# Patient Record
Sex: Female | Born: 1960 | Race: White | Hispanic: No | Marital: Married | Smoking: Never smoker
Health system: Southern US, Community
[De-identification: ages and names within clinical notes are randomized; demographics above are authoritative.]

## PROBLEM LIST (undated history)

## (undated) HISTORY — PX: ABDOMINAL HYSTERECTOMY: SHX81

---

## 2001-05-10 ENCOUNTER — Emergency Department (HOSPITAL_COMMUNITY): Admission: EM | Admit: 2001-05-10 | Discharge: 2001-05-10 | Payer: Self-pay | Admitting: Emergency Medicine

## 2001-05-10 ENCOUNTER — Encounter: Payer: Self-pay | Admitting: Emergency Medicine

## 2005-06-08 ENCOUNTER — Ambulatory Visit: Payer: Self-pay | Admitting: Unknown Physician Specialty

## 2006-07-26 ENCOUNTER — Ambulatory Visit: Payer: Self-pay | Admitting: Unknown Physician Specialty

## 2007-05-16 ENCOUNTER — Ambulatory Visit: Payer: Self-pay | Admitting: Unknown Physician Specialty

## 2007-07-29 ENCOUNTER — Ambulatory Visit: Payer: Self-pay | Admitting: Unknown Physician Specialty

## 2008-08-04 ENCOUNTER — Ambulatory Visit: Payer: Self-pay | Admitting: Unknown Physician Specialty

## 2009-11-04 ENCOUNTER — Ambulatory Visit: Payer: Self-pay | Admitting: Unknown Physician Specialty

## 2010-11-29 ENCOUNTER — Ambulatory Visit: Payer: Self-pay | Admitting: Unknown Physician Specialty

## 2012-01-24 ENCOUNTER — Ambulatory Visit: Payer: Self-pay | Admitting: Obstetrics and Gynecology

## 2012-06-28 DIAGNOSIS — I10 Essential (primary) hypertension: Secondary | ICD-10-CM | POA: Insufficient documentation

## 2012-07-04 DIAGNOSIS — E669 Obesity, unspecified: Secondary | ICD-10-CM | POA: Insufficient documentation

## 2013-01-28 ENCOUNTER — Ambulatory Visit: Payer: Self-pay | Admitting: Obstetrics and Gynecology

## 2013-12-05 ENCOUNTER — Ambulatory Visit: Payer: Self-pay | Admitting: Gastroenterology

## 2014-02-03 ENCOUNTER — Ambulatory Visit: Payer: Self-pay | Admitting: Family Medicine

## 2014-05-18 DIAGNOSIS — R7302 Impaired glucose tolerance (oral): Secondary | ICD-10-CM | POA: Insufficient documentation

## 2014-06-23 DIAGNOSIS — M1711 Unilateral primary osteoarthritis, right knee: Secondary | ICD-10-CM | POA: Insufficient documentation

## 2016-05-30 DIAGNOSIS — I83813 Varicose veins of bilateral lower extremities with pain: Secondary | ICD-10-CM | POA: Insufficient documentation

## 2016-05-30 DIAGNOSIS — J309 Allergic rhinitis, unspecified: Secondary | ICD-10-CM | POA: Insufficient documentation

## 2016-06-21 ENCOUNTER — Encounter: Payer: BC Managed Care – PPO | Attending: Pediatrics | Admitting: Dietician

## 2016-06-21 VITALS — Ht 64.0 in | Wt 279.2 lb

## 2016-06-21 DIAGNOSIS — I10 Essential (primary) hypertension: Secondary | ICD-10-CM

## 2016-06-21 DIAGNOSIS — E669 Obesity, unspecified: Secondary | ICD-10-CM

## 2016-06-21 NOTE — Progress Notes (Signed)
Medical Nutrition Therapy: Visit start time: 1630  end time: 1730  Assessment:  Diagnosis: HTN, obesity Past medical history: recent diagnosis of hyperthyroidism (subclinical), prediabetes, arthritis in knees Psychosocial issues/ stress concerns: patient reports moderate stress level Preferred learning method:  Jill Alexanders. Visual . Hands-on  Current weight: 279.2lbs  Height: 5'4" Medications, supplements: reviewed list in chart with patient  Progress and evaluation: Patient reports her husband had an MI about 3 months ago, so they both are working on healthier eating habits. She feels that much of her overweight is due to genetics; she would like to lose weight, but her primary focus is healthier habits rather than strict dieting.  Physical activity: no structured activity due to varicose veins (had a bleed about 4 months ago) -- will be treated next month.   Dietary Intake:  Usual eating pattern includes 3 meals and 1 snacks per day. Dining out frequency: 3 meals per week.  Breakfast: 5:30am -- bagel with cream cheese, or egg with cheese on bagel whole wheat; weekends bacon and eggs, toast, grits; drinks coffee with creamer (3c daily) Snack: maybe fruit or pkg crackers or carrots; sometimes none. Watermelon, banana, orange Lunch: 10:30-11am -- fixed menu at school cafeteria; today--ham and cheese on whole grain bun with lettuce, tomato, bag of original chips, with water and water enhancer Snack: none Supper: 3oz chicken baked/ fried, lima beans, corn, cornbread. Usually meat and vegetables. Sometimes cannot finish plate.  Snack: none Beverages: water, coffee; rarely soda  Nutrition Care Education: Topics covered: HTN, weight managemnet Basic nutrition: basic food groups, appropriate nutrient balance, appropriate meal and snack schedule, general nutrition guidelines    Weight control: benefits of weight control, behavioral changes for weight loss: portion control strategies, importance of low fat  and low sugar food choices, guidance for 1200-1300 kcal intake -- patient feels she is not eating much more than 1500kcal daily now. Hypertension: identifying high sodium foods, identifying food sources of Calcium, potassium, magnesium role of fiber food sources of  phytochemicals Other:   healthy and unhealthy fats, role of fiber, food sources of phytochemicals, options for light exercise/ chair exercise  Nutritional Diagnosis:  [place]-3.3 Overweight/obesity As related to excess calories, low metabolism.  As evidenced by patient report.  Intervention: Instruction as noted above.   Set goals with patient direction.   She declined a follow-up visit at this time, but will schedule later if needed.    Education Materials given:  . General diet guidelines for Hypertension . Food lists/ Planning A Balanced Meal . Sample meal pattern/ menus: Quick and Healthy Meal Ideas . Home Exercise Guide, office/ chair exercise handout . Goals/ instructions  Learner/ who was taught:  . Patient   Level of understanding: Marland Kitchen. Verbalizes/ demonstrates competency  Demonstrated degree of understanding via:   Teach back Learning barriers: . None  Willingness to learn/ readiness for change: . Acceptance, ready for change  Monitoring and Evaluation:  Dietary intake, exercise, BP control, and body weight      follow up: prn

## 2016-06-21 NOTE — Patient Instructions (Signed)
   Choose low fat foods at supper: baked, grilled meats, limit creamy/ cheesy sauces/ gravy.   Eat smaller portions; start with 1/2-2/3 cup starch portions and eat larger vegetable portions.   Start with some light chair exercises.   Keep track of your progress with your goals and allow yourself a reward (not food) every week, month, etc.

## 2016-06-22 ENCOUNTER — Encounter: Payer: Self-pay | Admitting: Dietician

## 2016-09-28 ENCOUNTER — Encounter (INDEPENDENT_AMBULATORY_CARE_PROVIDER_SITE_OTHER): Payer: Self-pay | Admitting: Vascular Surgery

## 2016-09-28 ENCOUNTER — Ambulatory Visit (INDEPENDENT_AMBULATORY_CARE_PROVIDER_SITE_OTHER): Payer: BC Managed Care – PPO | Admitting: Vascular Surgery

## 2016-09-28 ENCOUNTER — Encounter (INDEPENDENT_AMBULATORY_CARE_PROVIDER_SITE_OTHER): Payer: Self-pay

## 2016-09-28 VITALS — BP 125/67 | HR 60 | Resp 17 | Ht 64.0 in | Wt 290.0 lb

## 2016-09-28 DIAGNOSIS — I83813 Varicose veins of bilateral lower extremities with pain: Secondary | ICD-10-CM | POA: Diagnosis not present

## 2016-09-28 DIAGNOSIS — I10 Essential (primary) hypertension: Secondary | ICD-10-CM

## 2016-09-29 ENCOUNTER — Other Ambulatory Visit (INDEPENDENT_AMBULATORY_CARE_PROVIDER_SITE_OTHER): Payer: Self-pay | Admitting: Vascular Surgery

## 2016-09-29 DIAGNOSIS — I8310 Varicose veins of unspecified lower extremity with inflammation: Secondary | ICD-10-CM

## 2016-10-01 NOTE — Progress Notes (Signed)
The patient's left  lower extremity was sterilely prepped and draped.  The ultrasound machine was used to visualize the left great  saphenous vein throughout its course.  A segment below the knee was selected for access.  The saphenous vein was accessed without difficulty using ultrasound guidance with a micropuncture needle.   An 0.018  wire was placed beyond the saphenofemoral junction through the sheath and the microneedle was removed.  The 65 cm sheath was then placed over the wire and the wire and dilator were removed.  The laser fiber was placed through the sheath and its tip was placed approximately 2 cm below the saphenofemoral junction.  Tumescent anesthesia was then created with a dilute lidocaine solution.  Laser energy was then delivered with constant withdrawal of the sheath and laser fiber.  Approximately 1264 Joules of energy were delivered over a length of 42 cm.  Sterile dressings were placed.  The patient tolerated the procedure well without complications.

## 2016-10-02 ENCOUNTER — Ambulatory Visit (INDEPENDENT_AMBULATORY_CARE_PROVIDER_SITE_OTHER): Payer: BC Managed Care – PPO

## 2016-10-02 DIAGNOSIS — I8310 Varicose veins of unspecified lower extremity with inflammation: Secondary | ICD-10-CM | POA: Diagnosis not present

## 2016-10-23 ENCOUNTER — Encounter (INDEPENDENT_AMBULATORY_CARE_PROVIDER_SITE_OTHER): Payer: Self-pay | Admitting: Vascular Surgery

## 2016-10-23 ENCOUNTER — Ambulatory Visit (INDEPENDENT_AMBULATORY_CARE_PROVIDER_SITE_OTHER): Payer: BC Managed Care – PPO | Admitting: Vascular Surgery

## 2016-10-23 VITALS — BP 122/75 | HR 58 | Resp 16 | Ht 64.0 in | Wt 290.0 lb

## 2016-10-23 DIAGNOSIS — I872 Venous insufficiency (chronic) (peripheral): Secondary | ICD-10-CM

## 2016-10-23 DIAGNOSIS — I83813 Varicose veins of bilateral lower extremities with pain: Secondary | ICD-10-CM | POA: Diagnosis not present

## 2016-10-23 DIAGNOSIS — I10 Essential (primary) hypertension: Secondary | ICD-10-CM

## 2016-10-24 NOTE — Progress Notes (Signed)
MRN : [id number]  Vanessa Stuart is a 55 y.o. (Nov 18, 1960) female who presents with chief complaint of  Chief Complaint  Patient presents with  . Re-evaluation    2-3 week post laser  .  History of Present Illness: The patient returns to the office for followup status post laser ablation of the left great saphenous vein on 09/28/2016. The patient notes multiple residual varicosities bilaterally which continued to hurt with dependent positions and remained tender to palpation. The patient's swelling is unchanged from preoperative status. The patient continues to wear graduated compression stockings on a daily basis but these are not eliminating the pain and discomfort. The patient continues to use over-the-counter anti-inflammatory medications to treat the pain and related symptoms but this has not given the patient relief. The patient notes the pain in the lower extremities is causing problems with daily exercise, problems at work and even with household activities such as preparing meals and doing dishes.  The patient is otherwise done well and there have been no complications related to the laser procedure or interval changes in the patient's overall   Venous ultrasound post laser shows successful laser ablation of the left GSV, no DVT identified.  Current Meds  Medication Sig  . atenolol (TENORMIN) 100 MG tablet Take by mouth.  . Cinnamon 500 MG capsule Take by mouth.  . cyanocobalamin 1000 MCG tablet Take 1,000 mcg by mouth daily.  . fluticasone (FLONASE) 50 MCG/ACT nasal spray Place into the nose.  . Garlic 500 MG CAPS Take by mouth.  Vanessa Stuart Kitchen. ibuprofen (ADVIL,MOTRIN) 800 MG tablet Take by mouth.  . Lactobacillus (PROBIOTIC ACIDOPHILUS) TABS Take by mouth.  Vanessa Stuart Kitchen. lisinopril-hydrochlorothiazide (PRINZIDE,ZESTORETIC) 20-25 MG tablet Take by mouth.  . Misc Natural Products (GLUCOSAMINE CHONDROITIN TRIPLE) TABS Take by mouth.  . Multiple Vitamin (MULTI-VITAMINS) TABS Take by mouth.  . vitamin C  (ASCORBIC ACID) 500 MG tablet Take 500 mg by mouth daily.    No past medical history on file.  No past surgical history on file.  Social History Social History  Substance Use Topics  . Smoking status: Never Smoker  . Smokeless tobacco: Never Used  . Alcohol use No    Family History No family history on file. No family history of bleeding/clotting disorders, porphyria or autoimmune disease   Allergies  Allergen Reactions  . Codeine     Other reaction(s): Hallucination  . Penicillins      REVIEW OF SYSTEMS (Negative unless checked)  Constitutional: [] Weight loss  [] Fever  [] Chills Cardiac: [] Chest pain   [] Chest pressure   [] Palpitations   [] Shortness of breath when laying flat   [] Shortness of breath with exertion. Vascular:  [] Pain in legs with walking   [] Pain in legs at rest  [] History of DVT   [] Phlebitis   [x] Swelling in legs   [x] Varicose veins   [] Non-healing ulcers Pulmonary:   [] Uses home oxygen   [] Productive cough   [] Hemoptysis   [] Wheeze  [] COPD   [] Asthma Neurologic:  [] Dizziness   [] Seizures   [] History of stroke   [] History of TIA  [] Aphasia   [] Vissual changes   [] Weakness or numbness in arm   [] Weakness or numbness in leg Musculoskeletal:   [] Joint swelling   [] Joint pain   [] Low back pain Hematologic:  [] Easy bruising  [] Easy bleeding   [] Hypercoagulable state   [] Anemic Gastrointestinal:  [] Diarrhea   [] Vomiting  [] Gastroesophageal reflux/heartburn   [] Difficulty swallowing. Genitourinary:  [] Chronic kidney disease   [] Difficult urination  [] Frequent  urination   [] Blood in urine Skin:  [] Rashes   [] Ulcers  Psychological:  [] History of anxiety   []  History of major depression.  Physical Examination  Vitals:   10/23/16 1519  BP: 122/75  Pulse: (!) 58  Resp: 16  Weight: 290 lb (131.5 kg)  Height: 5\' 4"  (1.626 m)   Body mass index is 49.78 kg/m. Gen: WD/WN, NAD Head: [place]/AT, No temporalis wasting.  Ear/Nose/Throat: Hearing grossly intact, nares w/o  erythema or drainage, poor dentition Eyes: PER, EOMI, sclera nonicteric.  Neck: Supple, no masses.  No bruit or JVD.  Pulmonary:  Good air movement, clear to auscultation bilaterally, no use of accessory muscles.  Cardiac: RRR, normal S1, S2, no Murmurs. Vascular:  Large varicose veins of the left leg >10 mm;  1-2+ edema; moderate venous stasis changes Vessel Right Left  Radial Palpable Palpable  Ulnar Palpable Palpable  Brachial Palpable Palpable  Carotid Palpable Palpable  Femoral Palpable Palpable  Popliteal Palpable Palpable  PT Palpable Palpable  DP Palpable Palpable   Gastrointestinal: soft, non-distended. No guarding/no peritoneal signs.  Musculoskeletal: M/S 5/5 throughout.  No deformity or atrophy.  Neurologic: CN 2-12 intact. Pain and light touch intact in extremities.  Symmetrical.  Speech is fluent. Motor exam as listed above. Psychiatric: Judgment intact, Mood & affect appropriate for pt's clinical situation. Dermatologic: No rashes or ulcers noted.  No changes consistent with cellulitis. Lymph : No Cervical lymphadenopathy, no lichenification or skin changes of chronic lymphedema.  CBC No results found for: WBC, HGB, HCT, MCV, PLT  BMET No results found for: NA, K, CL, CO2, GLUCOSE, BUN, CREATININE, CALCIUM, GFRNONAA, GFRAA CrCl cannot be calculated (No order found.).  COAG No results found for: INR, PROTIME  Radiology No results found.  Assessment/Plan 1. Varicose veins of both lower extremities with pain Recommend:  The patient has had successful ablation of the previously incompetent saphenous venous system but still has persistent symptoms of pain and swelling that are having a negative impact on daily life and daily activities.  Patient should undergo injection sclerotherapy to treat the residual varicosities.  The risks, benefits and alternative therapies were reviewed in detail with the patient.  All questions were answered.  The patient agrees to  proceed with sclerotherapy at their convenience.  The patient will continue wearing the graduated compression stockings and using the over-the-counter pain medications to treat her symptoms.    2. Essential hypertension Continue antihypertensive medications as already ordered and reviewed, no changes at this time.  Continue statin as ordered and reviewed, no changes at this time  3. Chronic venous insufficiency See #1    Levora DredgeGregory Hady Niemczyk, MD  10/24/2016 7:27 PM

## 2016-11-27 ENCOUNTER — Ambulatory Visit (INDEPENDENT_AMBULATORY_CARE_PROVIDER_SITE_OTHER): Payer: BC Managed Care – PPO | Admitting: Vascular Surgery

## 2016-11-27 ENCOUNTER — Encounter (INDEPENDENT_AMBULATORY_CARE_PROVIDER_SITE_OTHER): Payer: Self-pay | Admitting: Vascular Surgery

## 2016-11-27 VITALS — BP 152/76 | HR 61 | Resp 17 | Wt 292.0 lb

## 2016-11-27 DIAGNOSIS — I872 Venous insufficiency (chronic) (peripheral): Secondary | ICD-10-CM

## 2016-11-27 DIAGNOSIS — I83813 Varicose veins of bilateral lower extremities with pain: Secondary | ICD-10-CM

## 2016-11-27 NOTE — Progress Notes (Signed)
   Indication:  Patient presents with symptomatic varicose veins of the bilateral lower extremity.  Procedure:  Sclerotherapy using hypertonic saline mixed with 1% Lidocaine was performed on the bilateral lower extremity.  Compression wraps were placed.  The patient tolerated the procedure well.  Plan:  Follow up as needed.  

## 2016-12-14 ENCOUNTER — Ambulatory Visit (INDEPENDENT_AMBULATORY_CARE_PROVIDER_SITE_OTHER): Payer: BC Managed Care – PPO | Admitting: Vascular Surgery

## 2016-12-19 ENCOUNTER — Other Ambulatory Visit: Payer: Self-pay | Admitting: Pediatrics

## 2016-12-19 DIAGNOSIS — Z1231 Encounter for screening mammogram for malignant neoplasm of breast: Secondary | ICD-10-CM

## 2016-12-25 ENCOUNTER — Ambulatory Visit: Admission: RE | Admit: 2016-12-25 | Payer: BC Managed Care – PPO | Source: Ambulatory Visit

## 2017-01-01 ENCOUNTER — Other Ambulatory Visit: Payer: Self-pay | Admitting: Pediatrics

## 2017-01-01 ENCOUNTER — Ambulatory Visit
Admission: RE | Admit: 2017-01-01 | Discharge: 2017-01-01 | Disposition: A | Discharge status: Home / Self Care | Payer: BC Managed Care – PPO | Source: Ambulatory Visit | Attending: Pediatrics | Admitting: Pediatrics

## 2017-01-01 DIAGNOSIS — Z1231 Encounter for screening mammogram for malignant neoplasm of breast: Secondary | ICD-10-CM

## 2017-01-08 ENCOUNTER — Encounter (INDEPENDENT_AMBULATORY_CARE_PROVIDER_SITE_OTHER): Payer: Self-pay | Admitting: Vascular Surgery

## 2017-01-08 ENCOUNTER — Ambulatory Visit (INDEPENDENT_AMBULATORY_CARE_PROVIDER_SITE_OTHER): Payer: BC Managed Care – PPO | Admitting: Vascular Surgery

## 2017-01-08 VITALS — BP 140/74 | HR 67 | Resp 16 | Ht 64.0 in | Wt 295.0 lb

## 2017-01-08 DIAGNOSIS — I872 Venous insufficiency (chronic) (peripheral): Secondary | ICD-10-CM

## 2017-01-08 DIAGNOSIS — I83813 Varicose veins of bilateral lower extremities with pain: Secondary | ICD-10-CM

## 2017-01-08 NOTE — Progress Notes (Signed)
   Indication:  Patient presents with symptomatic varicose veins of the left lower extremity.  Procedure:  Sclerotherapy using hypertonic saline mixed with 1% Lidocaine was performed on the left lower extremity.  Compression wraps were placed.  The patient tolerated the procedure well.  Plan:  Follow up as needed.   

## 2017-01-22 ENCOUNTER — Ambulatory Visit (INDEPENDENT_AMBULATORY_CARE_PROVIDER_SITE_OTHER): Payer: BC Managed Care – PPO | Admitting: Vascular Surgery

## 2017-01-23 ENCOUNTER — Encounter (INDEPENDENT_AMBULATORY_CARE_PROVIDER_SITE_OTHER): Payer: Self-pay | Admitting: Vascular Surgery

## 2017-01-23 ENCOUNTER — Ambulatory Visit (INDEPENDENT_AMBULATORY_CARE_PROVIDER_SITE_OTHER): Payer: BC Managed Care – PPO | Admitting: Vascular Surgery

## 2017-01-23 VITALS — BP 152/83 | HR 64 | Resp 16 | Ht 64.0 in | Wt 294.0 lb

## 2017-01-23 DIAGNOSIS — I83813 Varicose veins of bilateral lower extremities with pain: Secondary | ICD-10-CM | POA: Diagnosis not present

## 2017-02-05 ENCOUNTER — Ambulatory Visit (INDEPENDENT_AMBULATORY_CARE_PROVIDER_SITE_OTHER): Payer: BC Managed Care – PPO | Admitting: Vascular Surgery

## 2018-01-22 ENCOUNTER — Ambulatory Visit
Admission: RE | Admit: 2018-01-22 | Discharge: 2018-01-22 | Disposition: A | Discharge status: Home / Self Care | Payer: BC Managed Care – PPO | Source: Ambulatory Visit | Attending: Pediatrics | Admitting: Pediatrics

## 2018-01-22 ENCOUNTER — Other Ambulatory Visit: Payer: Self-pay | Admitting: Pediatrics

## 2018-01-22 DIAGNOSIS — R918 Other nonspecific abnormal finding of lung field: Secondary | ICD-10-CM | POA: Insufficient documentation

## 2018-01-22 DIAGNOSIS — R062 Wheezing: Secondary | ICD-10-CM | POA: Diagnosis present

## 2018-01-22 DIAGNOSIS — R768 Other specified abnormal immunological findings in serum: Secondary | ICD-10-CM

## 2018-02-21 ENCOUNTER — Other Ambulatory Visit: Payer: Self-pay | Admitting: Pediatrics

## 2018-02-21 DIAGNOSIS — Z1231 Encounter for screening mammogram for malignant neoplasm of breast: Secondary | ICD-10-CM

## 2018-03-05 ENCOUNTER — Ambulatory Visit
Admission: RE | Admit: 2018-03-05 | Discharge: 2018-03-05 | Disposition: A | Discharge status: Home / Self Care | Payer: BC Managed Care – PPO | Source: Ambulatory Visit | Attending: Pediatrics | Admitting: Pediatrics

## 2018-03-05 DIAGNOSIS — Z1231 Encounter for screening mammogram for malignant neoplasm of breast: Secondary | ICD-10-CM | POA: Diagnosis present

## 2018-03-15 DIAGNOSIS — T464X5A Adverse effect of angiotensin-converting-enzyme inhibitors, initial encounter: Secondary | ICD-10-CM | POA: Insufficient documentation

## 2018-03-15 DIAGNOSIS — R058 Other specified cough: Secondary | ICD-10-CM | POA: Insufficient documentation

## 2018-11-21 DIAGNOSIS — E669 Obesity, unspecified: Secondary | ICD-10-CM | POA: Insufficient documentation

## 2018-12-11 DIAGNOSIS — S83232A Complex tear of medial meniscus, current injury, left knee, initial encounter: Secondary | ICD-10-CM | POA: Insufficient documentation

## 2019-01-03 DIAGNOSIS — J45991 Cough variant asthma: Secondary | ICD-10-CM | POA: Insufficient documentation

## 2019-07-15 ENCOUNTER — Other Ambulatory Visit: Payer: Self-pay | Admitting: Pediatrics

## 2019-07-15 DIAGNOSIS — Z1231 Encounter for screening mammogram for malignant neoplasm of breast: Secondary | ICD-10-CM

## 2019-07-29 ENCOUNTER — Other Ambulatory Visit: Payer: Self-pay

## 2019-07-29 ENCOUNTER — Ambulatory Visit
Admission: RE | Admit: 2019-07-29 | Discharge: 2019-07-29 | Disposition: A | Discharge status: Home / Self Care | Payer: BC Managed Care – PPO | Source: Ambulatory Visit | Attending: Pediatrics | Admitting: Pediatrics

## 2019-07-29 DIAGNOSIS — Z1231 Encounter for screening mammogram for malignant neoplasm of breast: Secondary | ICD-10-CM

## 2019-08-07 DIAGNOSIS — A4902 Methicillin resistant Staphylococcus aureus infection, unspecified site: Secondary | ICD-10-CM | POA: Insufficient documentation

## 2020-03-23 ENCOUNTER — Encounter (INDEPENDENT_AMBULATORY_CARE_PROVIDER_SITE_OTHER): Payer: Self-pay | Admitting: Vascular Surgery

## 2020-03-23 ENCOUNTER — Ambulatory Visit (INDEPENDENT_AMBULATORY_CARE_PROVIDER_SITE_OTHER): Payer: BC Managed Care – PPO | Admitting: Vascular Surgery

## 2020-03-23 ENCOUNTER — Other Ambulatory Visit: Payer: Self-pay

## 2020-03-23 VITALS — BP 137/77 | HR 68 | Resp 16 | Wt 216.8 lb

## 2020-03-23 DIAGNOSIS — I83893 Varicose veins of bilateral lower extremities with other complications: Secondary | ICD-10-CM | POA: Diagnosis not present

## 2020-03-23 DIAGNOSIS — I83813 Varicose veins of bilateral lower extremities with pain: Secondary | ICD-10-CM

## 2020-03-23 DIAGNOSIS — I10 Essential (primary) hypertension: Secondary | ICD-10-CM

## 2020-03-23 NOTE — Patient Instructions (Signed)
Varicose Veins Varicose veins are veins that have become enlarged, bulged, and twisted. They most often appear in the legs. What are the causes? This condition is caused by damage to the valves in the vein. These valves help blood return to your heart. When they are damaged and they stop working properly, blood may flow backward and back up in the veins near the skin, causing the veins to get larger and appear twisted. The condition can result from any issue that causes blood to back up, like pregnancy, prolonged standing, or obesity. What increases the risk? This condition is more likely to develop in people who are:  On their feet a lot.  Pregnant.  Overweight. What are the signs or symptoms? Symptoms of this condition include:  Bulging, twisted, and bluish veins.  A feeling of heaviness. This may be worse at the end of the day.  Leg pain. This may be worse at the end of the day.  Swelling in the leg.  Changes in skin color over the veins. How is this diagnosed? This condition may be diagnosed based on your symptoms, a physical exam, and an ultrasound test. How is this treated? Treatment for this condition may involve:  Avoiding sitting or standing in one position for long periods of time.  Wearing compression stockings. These stockings help to prevent blood clots and reduce swelling in the legs.  Raising (elevating) the legs when resting.  Losing weight.  Exercising regularly. If you have persistent symptoms or want to improve the way your varicose veins look, you may choose to have a procedure to close the varicose veins off or to remove them. Treatments to close off the veins include:  Sclerotherapy. In this treatment, a solution is injected into a vein to close it off.  Laser treatment. In this treatment, the vein is heated with a laser to close it off.  Radiofrequency vein ablation. In this treatment, an electrical current produced by radio waves is used to close  off the vein. Treatments to remove the veins include:  Phlebectomy. In this treatment, the veins are removed through small incisions made over the veins.  Vein ligation and stripping. In this treatment, incisions are made over the veins. The veins are then removed after being tied (ligated) with stitches (sutures). Follow these instructions at home: Activity  Walk as much as possible. Walking increases blood flow. This helps blood return to the heart and takes pressure off your veins. It also increases your cardiovascular strength.  Follow your health care provider's instructions about exercising.  Do not stand or sit in one position for a long period of time.  Do not sit with your legs crossed.  Rest with your legs raised during the day. General instructions   Follow any diet instructions given to you by your health care provider.  Wear compression stockings as directed by your health care provider. Do not wear other kinds of tight clothing around your legs, pelvis, or waist.  Elevate your legs at night to above the level of your heart.  If you get a cut in the skin over the varicose vein and the vein bleeds: ? Lie down with your leg raised. ? Apply firm pressure to the cut with a clean cloth until the bleeding stops. ? Place a bandage (dressing) on the cut. Contact a health care provider if:  The skin around your varicose veins starts to break down.  You have pain, redness, tenderness, or hard swelling over a vein.  You   are uncomfortable because of pain.  You get a cut in the skin over a varicose vein and it will not stop bleeding. Summary  Varicose veins are veins that have become enlarged, bulged, and twisted. They most often appear in the legs.  This condition is caused by damage to the valves in the vein. These valves help blood return to your heart.  Treatment for this condition includes frequent movements, wearing compression stockings, losing weight, and  exercising regularly. In some cases, procedures are done to close off or remove the veins.  Treatment for this condition may include wearing compression stockings, elevating the legs, losing weight, and engaging in regular activity. In some cases, procedures are done to close off or remove the veins. This information is not intended to replace advice given to you by your health care provider. Make sure you discuss any questions you have with your health care provider. Document Revised: 12/26/2018 Document Reviewed: 11/22/2016 Elsevier Patient Education  2020 [employer]  

## 2020-03-23 NOTE — Progress Notes (Signed)
Patient ID: Vanessa Stuart, female   DOB: 05-01-1961, 59 y.o.   MRN: [id number]  Chief Complaint  Patient presents with  . New Patient (Initial Visit)    ref Behling venous stasis    HPI Vanessa Stuart is a 59 y.o. female.  I am asked to see the patient by Dr. Janene Harvey for evaluation of venous disease.  The patient has a previous history of venous disease in 3 to 4 years ago was treated with laser ablation of the left great saphenous vein followed by sclerotherapy treatments.  For several years, she did well with this.  Over the past 6 months she has had worsening prominent varicosities as well as pain and swelling in the left leg.  She had a small varicosity just above the lateral left ankle which had significant hemorrhage and was very difficult to stop bleeding.  She also had a varicosity hemorrhage on the right leg last year and this had even more bleeding.  These were spontaneous bleeds without trauma or injury.  She complains of a fair bit of pain and swelling in the right leg.  The right leg has not been previously treated for venous disease invasively.  She denies any chest pain or shortness of breath.  No previous history of DVT or superficial thrombophlebitis to her knowledge.     Past Medical History Hypertension Venous insufficiency   Past Surgical History:  Procedure Laterality Date  . ABDOMINAL HYSTERECTOMY       Family History  Problem Relation Age of Onset  . Breast cancer Neg Hx   No bleeding or clotting disorders. No aneurysms   Social History   Tobacco Use  . Smoking status: Never Smoker  . Smokeless tobacco: Never Used  Substance Use Topics  . Alcohol use: No  . Drug use: No    Allergies  Allergen Reactions  . Codeine     Other reaction(s): Hallucination  . Penicillins     Current Outpatient Medications  Medication Sig Dispense Refill  . albuterol (VENTOLIN HFA) 108 (90 Base) MCG/ACT inhaler INHALE 2 PUFFS INTO THE LUNGS EVERY 6 HOURS AS  NEEDED FOR WHEEZING    . BREO ELLIPTA 100-25 MCG/INH AEPB Inhale 1 puff into the lungs daily.    . Cholecalciferol (D3-1000) 25 MCG (1000 UT) capsule Take 1,000 Units by mouth daily.    . Cinnamon 500 MG capsule Take by mouth.    . cyanocobalamin 1000 MCG tablet Take 1,000 mcg by mouth daily.    . fluticasone (FLONASE) 50 MCG/ACT nasal spray Place into the nose.    . Garlic 952 MG CAPS Take by mouth.    . Lactobacillus (PROBIOTIC ACIDOPHILUS) TABS Take by mouth.    . Misc Natural Products (GLUCOSAMINE CHONDROITIN TRIPLE) TABS Take by mouth.    . Multiple Vitamin (MULTI-VITAMINS) TABS Take by mouth.    . olmesartan-hydrochlorothiazide (BENICAR HCT) 20-12.5 MG tablet Take 1 tablet by mouth daily.    . vitamin C (ASCORBIC ACID) 500 MG tablet Take 500 mg by mouth daily.    Marland Kitchen atenolol (TENORMIN) 100 MG tablet Take by mouth.    Marland Kitchen lisinopril-hydrochlorothiazide (PRINZIDE,ZESTORETIC) 20-25 MG tablet Take by mouth.     No current facility-administered medications for this visit.      REVIEW OF SYSTEMS (Negative unless checked)  Constitutional: [] Weight loss  [] Fever  [] Chills Cardiac: [] Chest pain   [] Chest pressure   [] Palpitations   [] Shortness of breath when laying flat   [] Shortness of breath  at rest   [] Shortness of breath with exertion. Vascular:  [] Pain in legs with walking   [] Pain in legs at rest   [] Pain in legs when laying flat   [] Claudication   [] Pain in feet when walking  [] Pain in feet at rest  [] Pain in feet when laying flat   [] History of DVT   [] Phlebitis   [x] Swelling in legs   [x] Varicose veins   [] Non-healing ulcers Pulmonary:   [] Uses home oxygen   [] Productive cough   [] Hemoptysis   [] Wheeze  [] COPD   [] Asthma Neurologic:  [] Dizziness  [] Blackouts   [] Seizures   [] History of stroke   [] History of TIA  [] Aphasia   [] Temporary blindness   [] Dysphagia   [] Weakness or numbness in arms   [] Weakness or numbness in legs Musculoskeletal:  [] Arthritis   [] Joint swelling   [] Joint pain    [] Low back pain Hematologic:  [] Easy bruising  [] Easy bleeding   [] Hypercoagulable state   [] Anemic  [] Hepatitis Gastrointestinal:  [] Blood in stool   [] Vomiting blood  [] Gastroesophageal reflux/heartburn   [] Abdominal pain Genitourinary:  [] Chronic kidney disease   [] Difficult urination  [] Frequent urination  [] Burning with urination   [] Hematuria Skin:  [] Rashes   [] Ulcers   [] Wounds Psychological:  [] History of anxiety   []  History of major depression.    Physical Exam BP 137/77 (BP Location: Right Arm)   Pulse 68   Resp 16   Wt 216 lb 12.8 oz (98.3 kg)   BMI 37.21 kg/m  Gen:  WD/WN, NAD Head: /AT, No temporalis wasting.  Ear/Nose/Throat: Hearing grossly intact, nares w/o erythema or drainage, oropharynx w/o Erythema/Exudate Eyes: Conjunctiva clear, sclera non-icteric  Neck: trachea midline.  No JVD.  Pulmonary:  Good air movement, respirations not labored, no use of accessory muscles  Cardiac: RRR, no JVD Vascular: Extensive varicosities in the right lower extremity measuring 1 to 2 mm in diameter.  The largest patch includes a bulbous varicosity just above the lateral right knee.  The left lower extremity has diffuse varicosities but does have several large more bulbous varicosities in the 3 mm range in the calf and anterior knee area.  Moderate stasis dermatitis changes are present bilaterally Vessel Right Left  Radial Palpable Palpable                          PT  1+  1+  DP  2+  2+    Musculoskeletal: M/S 5/5 throughout.  Extremities without ischemic changes.  No deformity or atrophy.  1+ right lower extremity edema, 1+ left lower extremity edema. Neurologic: Sensation grossly intact in extremities.  Symmetrical.  Speech is fluent. Motor exam as listed above. Psychiatric: Judgment intact, Mood & affect appropriate for pt's clinical situation. Dermatologic: No rashes or ulcers noted.  No cellulitis or open wounds.    Radiology No results found.  Labs No  results found for this or any previous visit (from the past 2160 hour(s)).  Assessment/Plan:  HTN (hypertension) blood pressure control important in reducing the progression of atherosclerotic disease. On appropriate oral medications.   Hemorrhage of varicose veins of both lower extremities The patient has had significant hemorrhage of varicosities in both lower extremities over the past several months.  This makes this a more pressing issue than it for only pain and swelling.  Venous work-up is planned with reflux studies in the near future at her convenience.  She will resume wearing compression stockings daily 20 to  30 mmHg and elevating her legs.  She will take anti-inflammatories for the discomfort.  If she has recurrent hemorrhage, she is advised to put pressure on the area and elevate her leg is much as possible.  We will see her back following her noninvasive studies to discuss the results and determine further treatment options.      Festus Barren 03/23/2020, 3:44 PM   This note was created with Dragon medical transcription system.  Any errors from dictation are unintentional.

## 2020-03-23 NOTE — Assessment & Plan Note (Signed)
blood pressure control important in reducing the progression of atherosclerotic disease. On appropriate oral medications.  

## 2020-03-23 NOTE — Assessment & Plan Note (Signed)
The patient has had significant hemorrhage of varicosities in both lower extremities over the past several months.  This makes this a more pressing issue than it for only pain and swelling.  Venous work-up is planned with reflux studies in the near future at her convenience.  She will resume wearing compression stockings daily 20 to 30 mmHg and elevating her legs.  She will take anti-inflammatories for the discomfort.  If she has recurrent hemorrhage, she is advised to put pressure on the area and elevate her leg is much as possible.  We will see her back following her noninvasive studies to discuss the results and determine further treatment options.

## 2020-03-29 ENCOUNTER — Ambulatory Visit (INDEPENDENT_AMBULATORY_CARE_PROVIDER_SITE_OTHER): Payer: BC Managed Care – PPO | Admitting: Nurse Practitioner

## 2020-03-29 ENCOUNTER — Encounter (INDEPENDENT_AMBULATORY_CARE_PROVIDER_SITE_OTHER): Payer: Self-pay | Admitting: Nurse Practitioner

## 2020-03-29 ENCOUNTER — Other Ambulatory Visit: Payer: Self-pay

## 2020-03-29 ENCOUNTER — Ambulatory Visit (INDEPENDENT_AMBULATORY_CARE_PROVIDER_SITE_OTHER): Payer: BC Managed Care – PPO

## 2020-03-29 VITALS — BP 130/76 | HR 54 | Resp 16 | Wt 212.8 lb

## 2020-03-29 DIAGNOSIS — I83893 Varicose veins of bilateral lower extremities with other complications: Secondary | ICD-10-CM

## 2020-03-29 DIAGNOSIS — I83813 Varicose veins of bilateral lower extremities with pain: Secondary | ICD-10-CM

## 2020-03-29 DIAGNOSIS — I10 Essential (primary) hypertension: Secondary | ICD-10-CM | POA: Diagnosis not present

## 2020-03-30 ENCOUNTER — Encounter (INDEPENDENT_AMBULATORY_CARE_PROVIDER_SITE_OTHER): Payer: Self-pay | Admitting: Nurse Practitioner

## 2020-03-30 NOTE — Progress Notes (Signed)
Subjective:    Patient ID: Vanessa Stuart, female    DOB: 08-14-1961, 59 y.o.   MRN: [id number] Chief Complaint  Patient presents with  . Follow-up    ultrasound follow up    Vanessa Stuart is a 59 y.o. female.  The patient previously has a history of laser ablation with the left great saphenous vein as well as some sclerotherapy treatments.  For the last several years she has done well however recently she has had worsening of the varicosities in her left lower extremity in addition to pain and swelling. The patient notes multiple residual varicosities bilaterally which continued to hurt with dependent positions and remained tender to palpation. The patient continues to wear graduated compression stockings on a daily basis but these are not eliminating the pain and discomfort. The patient continues to use over-the-counter anti-inflammatory medications to treat the pain and related symptoms but this has not given the patient relief. The patient notes the pain in the lower extremities is causing problems with daily exercise, problems at work and even with household activities such as preparing meals and doing dishes.  What is also very concerning for the patient is that she has had a number of hemorrhagic events from her varicosities.  These events were very difficult to stop bleeding.  She had a hemorrhage of the varicosities of her left lower extremity twice, these were able to be maintained at home.  However she had a varicosity hemorrhage on the right lower extremity which required emergency attention.  These are spontaneous bleeds without any trauma or injury.  She also continues to have a fair bit of swelling and pain in the right lower extremity.  She denies any chest pain shortness of breath.  No previous history of DVT or superficial venous thrombosis  Today noninvasive studies show no evidence of DVT or superficial venous thrombosis bilaterally.  The right lower extremity has evidence  of reflux in the common femoral vein as well as at the saphenofemoral junction.  The left lower extremity has reflux in his common femoral vein as well as its been there is also reflux seen in the great saphenous vein at the saphenofemoral junction as well as the mid thigh.  There is also evidence of prior ablation in the left lower extremity.  The patient also has evidence of reflux accessory saphenous vein that measures 0.62 cm however it is noted that it is tortuous.   Review of Systems  Cardiovascular: Positive for leg swelling.       Number of varicosities bilaterally  Hematological: Bruises/bleeds easily.       Objective:   Physical Exam Vitals reviewed.  Constitutional:      Appearance: Normal appearance.  Cardiovascular:     Rate and Rhythm: Normal rate and regular rhythm.     Pulses: Normal pulses.     Heart sounds: Normal heart sounds.     Comments: Large tortuous varicosity on left lower extremity that measures 3-4 mm in size, extending from medial thigh to over knee.    The right lower extremity has evidence of extensive cluster of prominent varicosities the entire cluster size is approximately 12 mm prominent to about 3 mm Skin:    Capillary Refill: Capillary refill takes less than 2 seconds.  Neurological:     Mental Status: She is alert and oriented to person, place, and time.  Psychiatric:        Mood and Affect: Mood normal.  Behavior: Behavior normal.        Thought Content: Thought content normal.        Judgment: Judgment normal.     BP 130/76 (BP Location: Right Arm)   Pulse (!) 54   Resp 16   Wt 212 lb 12.8 oz (96.5 kg)   BMI 36.53 kg/m   History reviewed. No pertinent past medical history.  Social History   Socioeconomic History  . Marital status: Married    Spouse name: Not on file  . Number of children: Not on file  . Years of education: Not on file  . Highest education level: Not on file  Occupational History  . Not on file  Tobacco  Use  . Smoking status: Never Smoker  . Smokeless tobacco: Never Used  Substance and Sexual Activity  . Alcohol use: No  . Drug use: No  . Sexual activity: Not on file  Other Topics Concern  . Not on file  Social History Narrative  . Not on file   Social Determinants of Health   Financial Resource Strain:   . Difficulty of Paying Living Expenses:   Food Insecurity:   . Worried About Programme researcher, broadcasting/film/video in the Last Year:   . Barista in the Last Year:   Transportation Needs:   . Freight forwarder (Medical):   Marland Kitchen Lack of Transportation (Non-Medical):   Physical Activity:   . Days of Exercise per Week:   . Minutes of Exercise per Session:   Stress:   . Feeling of Stress :   Social Connections:   . Frequency of Communication with Friends and Family:   . Frequency of Social Gatherings with Friends and Family:   . Attends Religious Services:   . Active Member of Clubs or Organizations:   . Attends Banker Meetings:   Marland Kitchen Marital Status:   Intimate Partner Violence:   . Fear of Current or Ex-Partner:   . Emotionally Abused:   Marland Kitchen Physically Abused:   . Sexually Abused:     Past Surgical History:  Procedure Laterality Date  . ABDOMINAL HYSTERECTOMY      Family History  Problem Relation Age of Onset  . Breast cancer Neg Hx     Allergies  Allergen Reactions  . Codeine     Other reaction(s): Hallucination  . Penicillins        Assessment & Plan:   1. 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  Patient should undergo injection foam sclerotherapy to treat the residual  varicosities as well as to prevent further episodes of hemorrhage from the bilateral lower extremities.   The risks, benefits and alternative therapies were reviewed in detail with the patient.  All questions were answered.  The patient agrees to proceed with foam sclerotherapy at their convenience.  The patient will continue wearing the graduated compression stockings and using the over-the-counter pain medications to treat her symptoms.       2. Essential hypertension Continue antihypertensive medications as already ordered, these medications have been reviewed and there are no changes at this time.    Current Outpatient Medications on File Prior  to Visit  Medication Sig Dispense Refill  . albuterol (VENTOLIN HFA) 108 (90 Base) MCG/ACT inhaler INHALE 2 PUFFS INTO THE LUNGS EVERY 6 HOURS AS NEEDED FOR WHEEZING    . BREO ELLIPTA 100-25 MCG/INH AEPB Inhale 1 puff into the lungs daily.    . Cholecalciferol (D3-1000) 25 MCG (1000 UT) capsule Take 1,000 Units by mouth daily.    . Cinnamon 500 MG capsule Take by mouth.    . cyanocobalamin 1000 MCG tablet Take 1,000 mcg by mouth daily.    . Garlic 500 MG CAPS Take by mouth.    . Lactobacillus (PROBIOTIC ACIDOPHILUS) TABS Take by mouth.    . Misc Natural Products (GLUCOSAMINE CHONDROITIN TRIPLE) TABS Take by mouth.    . Multiple Vitamin (MULTI-VITAMINS) TABS Take by mouth.    . olmesartan-hydrochlorothiazide (BENICAR HCT) 20-12.5 MG tablet Take 1 tablet by mouth daily.    . vitamin C (ASCORBIC ACID) 500 MG tablet Take 500 mg by mouth daily.    Marland Kitchen atenolol (TENORMIN) 100 MG tablet Take by mouth.    . fluticasone (FLONASE) 50 MCG/ACT nasal spray Place into the nose.    Marland Kitchen lisinopril-hydrochlorothiazide (PRINZIDE,ZESTORETIC) 20-25 MG tablet Take by mouth.     No current facility-administered medications on file prior to visit.    There are no Patient Instructions on file for this visit. No follow-ups on file.   Georgiana Spinner, NP

## 2020-05-04 ENCOUNTER — Ambulatory Visit (INDEPENDENT_AMBULATORY_CARE_PROVIDER_SITE_OTHER): Payer: BC Managed Care – PPO | Admitting: Vascular Surgery

## 2020-05-11 ENCOUNTER — Ambulatory Visit (INDEPENDENT_AMBULATORY_CARE_PROVIDER_SITE_OTHER): Payer: BC Managed Care – PPO | Admitting: Vascular Surgery

## 2020-05-11 ENCOUNTER — Other Ambulatory Visit: Payer: Self-pay

## 2020-05-11 ENCOUNTER — Encounter (INDEPENDENT_AMBULATORY_CARE_PROVIDER_SITE_OTHER): Payer: Self-pay | Admitting: Vascular Surgery

## 2020-05-11 VITALS — BP 157/78 | HR 74 | Ht 64.0 in | Wt 200.0 lb

## 2020-05-11 DIAGNOSIS — I83813 Varicose veins of bilateral lower extremities with pain: Secondary | ICD-10-CM

## 2020-05-11 DIAGNOSIS — I83812 Varicose veins of left lower extremities with pain: Secondary | ICD-10-CM | POA: Diagnosis not present

## 2020-05-11 NOTE — Progress Notes (Signed)
  Vanessa Stuart is a 59 y.o.female who presents with painful varicose veins of the left leg  No past medical history on file.  Past Surgical History:  Procedure Laterality Date  . ABDOMINAL HYSTERECTOMY      Current Outpatient Medications  Medication Sig Dispense Refill  . albuterol (VENTOLIN HFA) 108 (90 Base) MCG/ACT inhaler INHALE 2 PUFFS INTO THE LUNGS EVERY 6 HOURS AS NEEDED FOR WHEEZING    . BREO ELLIPTA 100-25 MCG/INH AEPB Inhale 1 puff into the lungs daily.    . Cholecalciferol (D3-1000) 25 MCG (1000 UT) capsule Take 1,000 Units by mouth daily.    . Cinnamon 500 MG capsule Take by mouth.    . cyanocobalamin 1000 MCG tablet Take 1,000 mcg by mouth daily.    . Garlic 500 MG CAPS Take by mouth.    . Lactobacillus (PROBIOTIC ACIDOPHILUS) TABS Take by mouth.    . Misc Natural Products (GLUCOSAMINE CHONDROITIN TRIPLE) TABS Take by mouth.    . Multiple Vitamin (MULTI-VITAMINS) TABS Take by mouth.    . olmesartan-hydrochlorothiazide (BENICAR HCT) 20-12.5 MG tablet Take 1 tablet by mouth daily.    . vitamin C (ASCORBIC ACID) 500 MG tablet Take 500 mg by mouth daily.    Marland Kitchen atenolol (TENORMIN) 100 MG tablet Take by mouth.    . fluticasone (FLONASE) 50 MCG/ACT nasal spray Place into the nose.    Marland Kitchen lisinopril-hydrochlorothiazide (PRINZIDE,ZESTORETIC) 20-25 MG tablet Take by mouth.     No current facility-administered medications for this visit.    Allergies  Allergen Reactions  . Codeine     Other reaction(s): Hallucination  . Penicillins     Indication: Patient presents with symptomatic varicose veins of the left lower extremity.  Procedure: Foam sclerotherapy was performed on the left lower extremity. Using ultrasound guidance, 5 mL of foam Sotradecol was used to inject the varicosities of the left lower extremity. Compression wraps were placed. The patient tolerated the procedure well.

## 2020-06-08 ENCOUNTER — Other Ambulatory Visit: Payer: Self-pay

## 2020-06-08 ENCOUNTER — Encounter (INDEPENDENT_AMBULATORY_CARE_PROVIDER_SITE_OTHER): Payer: Self-pay | Admitting: Vascular Surgery

## 2020-06-08 ENCOUNTER — Ambulatory Visit (INDEPENDENT_AMBULATORY_CARE_PROVIDER_SITE_OTHER): Payer: BC Managed Care – PPO | Admitting: Vascular Surgery

## 2020-06-08 VITALS — BP 169/76 | HR 62 | Resp 16 | Wt 198.0 lb

## 2020-06-08 DIAGNOSIS — I83811 Varicose veins of right lower extremities with pain: Secondary | ICD-10-CM | POA: Diagnosis not present

## 2020-06-08 DIAGNOSIS — I83813 Varicose veins of bilateral lower extremities with pain: Secondary | ICD-10-CM

## 2020-06-08 NOTE — Progress Notes (Signed)
Vanessa Stuart is a 59 y.o.female who presents with painful varicose veins of the right leg  No past medical history on file.  Past Surgical History:  Procedure Laterality Date  . ABDOMINAL HYSTERECTOMY      Current Outpatient Medications  Medication Sig Dispense Refill  . albuterol (VENTOLIN HFA) 108 (90 Base) MCG/ACT inhaler INHALE 2 PUFFS INTO THE LUNGS EVERY 6 HOURS AS NEEDED FOR WHEEZING    . BREO ELLIPTA 100-25 MCG/INH AEPB Inhale 1 puff into the lungs daily.    . Cholecalciferol (D3-1000) 25 MCG (1000 UT) capsule Take 1,000 Units by mouth daily.    . Cinnamon 500 MG capsule Take by mouth.    . cyanocobalamin 1000 MCG tablet Take 1,000 mcg by mouth daily.    . Garlic 500 MG CAPS Take by mouth.    . Lactobacillus (PROBIOTIC ACIDOPHILUS) TABS Take by mouth.    . Misc Natural Products (GLUCOSAMINE CHONDROITIN TRIPLE) TABS Take by mouth.    . Multiple Vitamin (MULTI-VITAMINS) TABS Take by mouth.    . olmesartan-hydrochlorothiazide (BENICAR HCT) 20-12.5 MG tablet Take 1 tablet by mouth daily.    . vitamin C (ASCORBIC ACID) 500 MG tablet Take 500 mg by mouth daily.    Marland Kitchen atenolol (TENORMIN) 100 MG tablet Take by mouth.    . fluticasone (FLONASE) 50 MCG/ACT nasal spray Place into the nose.    Marland Kitchen lisinopril-hydrochlorothiazide (PRINZIDE,ZESTORETIC) 20-25 MG tablet Take by mouth.     No current facility-administered medications for this visit.    Allergies  Allergen Reactions  . Codeine     Other reaction(s): Hallucination  . Penicillins     Indication: Patient presents with symptomatic varicose veins of the right lower extremity.  Procedure: Foam sclerotherapy was performed on the right lower extremity. Using ultrasound guidance, 5 mL of foam Sotradecol was used to inject the varicosities of the right lower extremity. Compression wraps were placed. The patient tolerated the procedure well.

## 2020-07-06 ENCOUNTER — Ambulatory Visit (INDEPENDENT_AMBULATORY_CARE_PROVIDER_SITE_OTHER): Payer: BC Managed Care – PPO | Admitting: Vascular Surgery

## 2020-07-06 ENCOUNTER — Encounter (INDEPENDENT_AMBULATORY_CARE_PROVIDER_SITE_OTHER): Payer: Self-pay | Admitting: Vascular Surgery

## 2020-07-06 ENCOUNTER — Other Ambulatory Visit: Payer: Self-pay

## 2020-07-06 VITALS — BP 120/75 | HR 61 | Resp 16 | Wt 193.0 lb

## 2020-07-06 DIAGNOSIS — I83813 Varicose veins of bilateral lower extremities with pain: Secondary | ICD-10-CM

## 2020-07-06 DIAGNOSIS — I83812 Varicose veins of left lower extremities with pain: Secondary | ICD-10-CM | POA: Diagnosis not present

## 2020-07-06 NOTE — Progress Notes (Signed)
  Vanessa Stuart is a 59 y.o.female who presents with painful varicose veins of the left leg  No past medical history on file.  Past Surgical History:  Procedure Laterality Date  . ABDOMINAL HYSTERECTOMY      Current Outpatient Medications  Medication Sig Dispense Refill  . albuterol (VENTOLIN HFA) 108 (90 Base) MCG/ACT inhaler INHALE 2 PUFFS INTO THE LUNGS EVERY 6 HOURS AS NEEDED FOR WHEEZING    . BREO ELLIPTA 100-25 MCG/INH AEPB Inhale 1 puff into the lungs daily.    . Cholecalciferol (D3-1000) 25 MCG (1000 UT) capsule Take 1,000 Units by mouth daily.    . Cinnamon 500 MG capsule Take by mouth.    . cyanocobalamin 1000 MCG tablet Take 1,000 mcg by mouth daily.    . Garlic 500 MG CAPS Take by mouth.    Marland Kitchen ibuprofen (ADVIL) 800 MG tablet TAKE 1 TABLET(800 MG) BY MOUTH EVERY 8 HOURS AS NEEDED FOR PAIN    . Lactobacillus (PROBIOTIC ACIDOPHILUS) TABS Take by mouth.    . Misc Natural Products (GLUCOSAMINE CHONDROITIN TRIPLE) TABS Take by mouth.    . Multiple Vitamin (MULTI-VITAMINS) TABS Take by mouth.    . olmesartan-hydrochlorothiazide (BENICAR HCT) 20-12.5 MG tablet Take 1 tablet by mouth daily.    . vitamin C (ASCORBIC ACID) 500 MG tablet Take 500 mg by mouth daily.    Marland Kitchen atenolol (TENORMIN) 100 MG tablet Take by mouth.    . fluticasone (FLONASE) 50 MCG/ACT nasal spray Place into the nose.    Marland Kitchen lisinopril-hydrochlorothiazide (PRINZIDE,ZESTORETIC) 20-25 MG tablet Take by mouth.     No current facility-administered medications for this visit.    Allergies  Allergen Reactions  . Codeine     Other reaction(s): Hallucination  . Penicillins     Indication: Patient presents with symptomatic varicose veins of the left lower extremity.  Procedure: Foam sclerotherapy was performed on the left lower extremity. Using ultrasound guidance, 5 mL of foam Sotradecol was used to inject the varicosities of the left lower extremity. Compression wraps were placed. The patient tolerated the  procedure well.  The patient does still have a large amount of residual varicosities particularly on the left lower extremity that would benefit from further foam sclerotherapy as well as some saline sclerotherapy.  I would anticipate at least 3-4 more treatments being required to adequately remove these residual painful varicosities.

## 2020-07-27 ENCOUNTER — Other Ambulatory Visit: Payer: Self-pay | Admitting: Pediatrics

## 2020-08-10 ENCOUNTER — Other Ambulatory Visit: Payer: Self-pay | Admitting: Pediatrics

## 2020-08-10 DIAGNOSIS — N644 Mastodynia: Secondary | ICD-10-CM

## 2020-08-17 ENCOUNTER — Encounter (INDEPENDENT_AMBULATORY_CARE_PROVIDER_SITE_OTHER): Payer: Self-pay | Admitting: Vascular Surgery

## 2020-08-17 ENCOUNTER — Other Ambulatory Visit: Payer: Self-pay

## 2020-08-17 ENCOUNTER — Ambulatory Visit (INDEPENDENT_AMBULATORY_CARE_PROVIDER_SITE_OTHER): Payer: BC Managed Care – PPO | Admitting: Vascular Surgery

## 2020-08-17 VITALS — BP 147/75 | HR 60 | Resp 16 | Wt 181.4 lb

## 2020-08-17 DIAGNOSIS — I83813 Varicose veins of bilateral lower extremities with pain: Secondary | ICD-10-CM | POA: Diagnosis not present

## 2020-08-17 DIAGNOSIS — I83812 Varicose veins of left lower extremities with pain: Secondary | ICD-10-CM

## 2020-08-17 NOTE — Progress Notes (Signed)
  Vanessa Stuart is a 59 y.o.female who presents with painful varicose veins of the left leg  No past medical history on file.  Past Surgical History:  Procedure Laterality Date  . ABDOMINAL HYSTERECTOMY      Current Outpatient Medications  Medication Sig Dispense Refill  . albuterol (VENTOLIN HFA) 108 (90 Base) MCG/ACT inhaler INHALE 2 PUFFS INTO THE LUNGS EVERY 6 HOURS AS NEEDED FOR WHEEZING    . BREO ELLIPTA 100-25 MCG/INH AEPB Inhale 1 puff into the lungs daily.    . Cholecalciferol (D3-1000) 25 MCG (1000 UT) capsule Take 1,000 Units by mouth daily.    . Cinnamon 500 MG capsule Take by mouth.    . cyanocobalamin 1000 MCG tablet Take 1,000 mcg by mouth daily.    . Garlic 500 MG CAPS Take by mouth.    Marland Kitchen ibuprofen (ADVIL) 800 MG tablet TAKE 1 TABLET(800 MG) BY MOUTH EVERY 8 HOURS AS NEEDED FOR PAIN    . Lactobacillus (PROBIOTIC ACIDOPHILUS) TABS Take by mouth.    . Misc Natural Products (GLUCOSAMINE CHONDROITIN TRIPLE) TABS Take by mouth.    . Multiple Vitamin (MULTI-VITAMINS) TABS Take by mouth.    . olmesartan-hydrochlorothiazide (BENICAR HCT) 20-12.5 MG tablet Take 1 tablet by mouth daily.    Marland Kitchen triamcinolone cream (KENALOG) 0.1 % Apply topically 2 (two) times daily as needed.    . vitamin C (ASCORBIC ACID) 500 MG tablet Take 500 mg by mouth daily.    Marland Kitchen atenolol (TENORMIN) 100 MG tablet Take by mouth.    . fluticasone (FLONASE) 50 MCG/ACT nasal spray Place into the nose.    Marland Kitchen lisinopril-hydrochlorothiazide (PRINZIDE,ZESTORETIC) 20-25 MG tablet Take by mouth.     No current facility-administered medications for this visit.    Allergies  Allergen Reactions  . Codeine     Other reaction(s): Hallucination  . Penicillins     Indication: Patient presents with symptomatic varicose veins of the left lower extremity.  Procedure: Foam sclerotherapy was performed on the left lower extremity. Using ultrasound guidance, 5 mL of foam Sotradecol was used to inject the varicosities of  the left lower extremity. Compression wraps were placed. The patient tolerated the procedure well.

## 2020-08-25 ENCOUNTER — Ambulatory Visit: Admission: RE | Admit: 2020-08-25 | Payer: BC Managed Care – PPO | Source: Ambulatory Visit

## 2020-08-25 ENCOUNTER — Other Ambulatory Visit: Payer: Self-pay

## 2020-08-25 ENCOUNTER — Ambulatory Visit
Admission: RE | Admit: 2020-08-25 | Discharge: 2020-08-25 | Disposition: A | Discharge status: Home / Self Care | Payer: BC Managed Care – PPO | Source: Ambulatory Visit | Attending: Pediatrics | Admitting: Pediatrics

## 2020-08-25 DIAGNOSIS — N644 Mastodynia: Secondary | ICD-10-CM | POA: Diagnosis present

## 2020-09-14 ENCOUNTER — Ambulatory Visit (INDEPENDENT_AMBULATORY_CARE_PROVIDER_SITE_OTHER): Payer: BC Managed Care – PPO | Admitting: Vascular Surgery

## 2020-09-21 ENCOUNTER — Other Ambulatory Visit: Payer: Self-pay

## 2020-09-21 ENCOUNTER — Ambulatory Visit (INDEPENDENT_AMBULATORY_CARE_PROVIDER_SITE_OTHER): Payer: BC Managed Care – PPO | Admitting: Vascular Surgery

## 2020-09-21 VITALS — BP 122/75 | HR 74 | Ht 64.0 in | Wt 173.0 lb

## 2020-09-21 DIAGNOSIS — I83812 Varicose veins of left lower extremities with pain: Secondary | ICD-10-CM | POA: Diagnosis not present

## 2020-09-21 DIAGNOSIS — I83893 Varicose veins of bilateral lower extremities with other complications: Secondary | ICD-10-CM

## 2020-09-21 DIAGNOSIS — I83813 Varicose veins of bilateral lower extremities with pain: Secondary | ICD-10-CM

## 2020-09-21 NOTE — Progress Notes (Signed)
  Vanessa Stuart is a 59 y.o.female who presents with painful varicose veins of the left leg  No past medical history on file.  Past Surgical History:  Procedure Laterality Date  . ABDOMINAL HYSTERECTOMY      Current Outpatient Medications  Medication Sig Dispense Refill  . albuterol (VENTOLIN HFA) 108 (90 Base) MCG/ACT inhaler INHALE 2 PUFFS INTO THE LUNGS EVERY 6 HOURS AS NEEDED FOR WHEEZING    . BREO ELLIPTA 100-25 MCG/INH AEPB Inhale 1 puff into the lungs daily.    . Cholecalciferol (D3-1000) 25 MCG (1000 UT) capsule Take 1,000 Units by mouth daily.    . Cinnamon 500 MG capsule Take by mouth.    . cyanocobalamin 1000 MCG tablet Take 1,000 mcg by mouth daily.    . Garlic 500 MG CAPS Take by mouth.    . ibuprofen (ADVIL) 800 MG tablet TAKE 1 TABLET(800 MG) BY MOUTH EVERY 8 HOURS AS NEEDED FOR PAIN    . Lactobacillus (PROBIOTIC ACIDOPHILUS) TABS Take by mouth.    . Misc Natural Products (GLUCOSAMINE CHONDROITIN TRIPLE) TABS Take by mouth.    . Multiple Vitamin (MULTI-VITAMINS) TABS Take by mouth.    . olmesartan-hydrochlorothiazide (BENICAR HCT) 20-12.5 MG tablet Take 1 tablet by mouth daily.    . triamcinolone cream (KENALOG) 0.1 % Apply topically 2 (two) times daily as needed.    . vitamin C (ASCORBIC ACID) 500 MG tablet Take 500 mg by mouth daily.    . atenolol (TENORMIN) 100 MG tablet Take by mouth.    . fluticasone (FLONASE) 50 MCG/ACT nasal spray Place into the nose.    . lisinopril-hydrochlorothiazide (PRINZIDE,ZESTORETIC) 20-25 MG tablet Take by mouth.     No current facility-administered medications for this visit.    Allergies  Allergen Reactions  . Codeine     Other reaction(s): Hallucination  . Penicillins     Indication: Patient presents with symptomatic varicose veins of the left lower extremity.  Procedure: Foam sclerotherapy was performed on the left lower extremity. Using ultrasound guidance, 5 mL of foam Sotradecol was used to inject the varicosities of  the left lower extremity. Compression wraps were placed. The patient tolerated the procedure well. 

## 2020-10-05 ENCOUNTER — Ambulatory Visit (INDEPENDENT_AMBULATORY_CARE_PROVIDER_SITE_OTHER): Payer: BC Managed Care – PPO | Admitting: Vascular Surgery

## 2020-10-15 ENCOUNTER — Ambulatory Visit (INDEPENDENT_AMBULATORY_CARE_PROVIDER_SITE_OTHER): Payer: BC Managed Care – PPO | Admitting: Vascular Surgery

## 2020-10-15 ENCOUNTER — Other Ambulatory Visit: Payer: Self-pay

## 2020-10-15 VITALS — BP 137/83 | HR 64 | Ht 64.0 in | Wt 169.0 lb

## 2020-10-15 DIAGNOSIS — I83813 Varicose veins of bilateral lower extremities with pain: Secondary | ICD-10-CM | POA: Diagnosis not present

## 2020-10-15 NOTE — Progress Notes (Signed)
  Vanessa Stuart is a 59 y.o.female who presents with painful varicose veins of the left leg  No past medical history on file.  Past Surgical History:  Procedure Laterality Date  . ABDOMINAL HYSTERECTOMY      Current Outpatient Medications  Medication Sig Dispense Refill  . albuterol (VENTOLIN HFA) 108 (90 Base) MCG/ACT inhaler INHALE 2 PUFFS INTO THE LUNGS EVERY 6 HOURS AS NEEDED FOR WHEEZING    . BREO ELLIPTA 100-25 MCG/INH AEPB Inhale 1 puff into the lungs daily.    . Cholecalciferol (D3-1000) 25 MCG (1000 UT) capsule Take 1,000 Units by mouth daily.    . Cinnamon 500 MG capsule Take by mouth.    . cyanocobalamin 1000 MCG tablet Take 1,000 mcg by mouth daily.    . Garlic 500 MG CAPS Take by mouth.    Marland Kitchen ibuprofen (ADVIL) 800 MG tablet TAKE 1 TABLET(800 MG) BY MOUTH EVERY 8 HOURS AS NEEDED FOR PAIN    . Lactobacillus (PROBIOTIC ACIDOPHILUS) TABS Take by mouth.    . Misc Natural Products (GLUCOSAMINE CHONDROITIN TRIPLE) TABS Take by mouth.    . Multiple Vitamin (MULTI-VITAMINS) TABS Take by mouth.    . olmesartan-hydrochlorothiazide (BENICAR HCT) 20-12.5 MG tablet Take 1 tablet by mouth daily.    Marland Kitchen triamcinolone cream (KENALOG) 0.1 % Apply topically 2 (two) times daily as needed.    . vitamin C (ASCORBIC ACID) 500 MG tablet Take 500 mg by mouth daily.    Marland Kitchen atenolol (TENORMIN) 100 MG tablet Take by mouth.    . fluticasone (FLONASE) 50 MCG/ACT nasal spray Place into the nose.    Marland Kitchen lisinopril-hydrochlorothiazide (PRINZIDE,ZESTORETIC) 20-25 MG tablet Take by mouth.     No current facility-administered medications for this visit.    Allergies  Allergen Reactions  . Codeine     Other reaction(s): Hallucination  . Penicillins     Indication: Patient presents with symptomatic varicose veins of the left lower extremity.  Procedure: Foam sclerotherapy was performed on the left lower extremity. Using ultrasound guidance, 5 mL of foam Sotradecol was used to inject the varicosities of  the left lower extremity. Compression wraps were placed. The patient tolerated the procedure well.  The patient still has significant painful residual varicosities bilaterally and would benefit from continued sclerotherapy treatments going forward.

## 2020-12-09 ENCOUNTER — Telehealth (INDEPENDENT_AMBULATORY_CARE_PROVIDER_SITE_OTHER): Payer: Self-pay | Admitting: Vascular Surgery

## 2020-12-09 NOTE — Telephone Encounter (Signed)
Called stating that she contacted her insurance [employer] and they sent over forms to be signed by [place] and sent back.

## 2020-12-10 NOTE — Telephone Encounter (Signed)
I do not have any paperwork for her

## 2020-12-10 NOTE — Telephone Encounter (Signed)
Patient will reach out to [facility] to have them refax the forms to the office

## 2020-12-10 NOTE — Telephone Encounter (Signed)
I have you received any paperwork for this patient

## 2021-02-21 ENCOUNTER — Telehealth (INDEPENDENT_AMBULATORY_CARE_PROVIDER_SITE_OTHER): Payer: Self-pay | Admitting: Vascular Surgery

## 2021-02-21 NOTE — Telephone Encounter (Addendum)
Will ask provider on his next scheduled day. I messaged the provider making him aware of the pt wanting to speak with him.

## 2021-02-21 NOTE — Telephone Encounter (Signed)
Patient walked in to office to fill out medical release form to another practice.  Patient would like to speak with Dr. Wyn Quaker in regards to this decision.  She explains issues with getting sclerotherapy.

## 2021-03-07 NOTE — Telephone Encounter (Signed)
Patient called in requesting to speak with Dr. Wyn Quaker regarding her decision on moving to [facility].  Patient also states she spoke to [facility] and they have not received records.  Please advise.

## 2021-03-07 NOTE — Telephone Encounter (Signed)
I left a detailed message on patient voicemail stating that medical records were faxed on 02/23/21 but I will refax requesting records to [facility]

## 2021-10-26 ENCOUNTER — Other Ambulatory Visit: Payer: Self-pay | Admitting: Pediatrics

## 2021-10-26 DIAGNOSIS — Z1231 Encounter for screening mammogram for malignant neoplasm of breast: Secondary | ICD-10-CM

## 2021-11-03 ENCOUNTER — Telehealth (INDEPENDENT_AMBULATORY_CARE_PROVIDER_SITE_OTHER): Payer: Self-pay

## 2021-11-03 NOTE — Telephone Encounter (Signed)
Patient called in with questions about her billing and procedures done in the office. She stated that her insurance company called stating that they did not pay for her Sclero's. And that she max ed out for the year (lifetime) and she is upset because she states "Dr. Wyn Quaker should have known about that and not had her come in as much as he did". She is saying that she has called in about this a few months back and has not heard anything back from anyone. She is saying she owes $386.62 x 2 =[phone].  She told whomever she spoke with prior that she thought Dr. Wyn Quaker should waive one or two of the procedures because no one told her she would she max out getting these done.  The insurance is claiming she had one done also on 12/12/202 and wasn't seen in the office anymore after 10/15/2020. She is asking for Dr.Dew to call or upper management ASAP

## 2021-11-17 NOTE — Telephone Encounter (Signed)
Called patient to get more information and she advised she is not very happy with the practice but she did take the time to tell me what her issues are. She has a bill and states she feels she got unnecessary procedures and was never given an end date which upsets her a lot. She is going to fax me the EOB's because some of them she feels are not correct because dates do not match up. Gave her the fax number and advised her once I get them I will research and get back to her as soon as I can.

## 2021-11-28 ENCOUNTER — Ambulatory Visit
Admission: RE | Admit: 2021-11-28 | Discharge: 2021-11-28 | Disposition: A | Discharge status: Home / Self Care | Payer: BC Managed Care – PPO | Source: Ambulatory Visit | Attending: Pediatrics | Admitting: Pediatrics

## 2021-11-28 ENCOUNTER — Other Ambulatory Visit: Payer: Self-pay

## 2021-11-28 DIAGNOSIS — Z1231 Encounter for screening mammogram for malignant neoplasm of breast: Secondary | ICD-10-CM | POA: Insufficient documentation

## 2021-11-29 ENCOUNTER — Other Ambulatory Visit: Payer: Self-pay | Admitting: *Deleted

## 2021-11-29 ENCOUNTER — Inpatient Hospital Stay
Admission: RE | Admit: 2021-11-29 | Discharge: 2021-11-29 | Disposition: A | Discharge status: Home / Self Care | Payer: Self-pay | Source: Ambulatory Visit | Attending: *Deleted | Admitting: *Deleted

## 2021-11-29 DIAGNOSIS — Z1231 Encounter for screening mammogram for malignant neoplasm of breast: Secondary | ICD-10-CM

## 2022-09-11 ENCOUNTER — Encounter (INDEPENDENT_AMBULATORY_CARE_PROVIDER_SITE_OTHER): Payer: Self-pay

## 2023-02-19 ENCOUNTER — Other Ambulatory Visit: Payer: Self-pay | Admitting: Pediatrics

## 2023-02-19 DIAGNOSIS — Z1231 Encounter for screening mammogram for malignant neoplasm of breast: Secondary | ICD-10-CM

## 2023-03-19 ENCOUNTER — Ambulatory Visit
Admission: RE | Admit: 2023-03-19 | Discharge: 2023-03-19 | Disposition: A | Discharge status: Home / Self Care | Payer: BC Managed Care – PPO | Source: Ambulatory Visit | Attending: Pediatrics | Admitting: Pediatrics

## 2023-03-19 DIAGNOSIS — Z1231 Encounter for screening mammogram for malignant neoplasm of breast: Secondary | ICD-10-CM

## 2023-10-10 IMAGING — MG MM DIGITAL SCREENING BILAT W/ TOMO AND CAD
8 series · 8 of 24 positions shown · non-contrast
Comparison: Previous exam(s).

CLINICAL DATA: Screening.

EXAM:
DIGITAL SCREENING BILATERAL MAMMOGRAM WITH TOMOSYNTHESIS AND CAD
TECHNIQUE: Bilateral screening digital craniocaudal and mediolateral oblique
mammograms were obtained. Bilateral screening digital breast
tomosynthesis was performed. The images were evaluated with
computer-aided detection.

[R CC synth-2D]
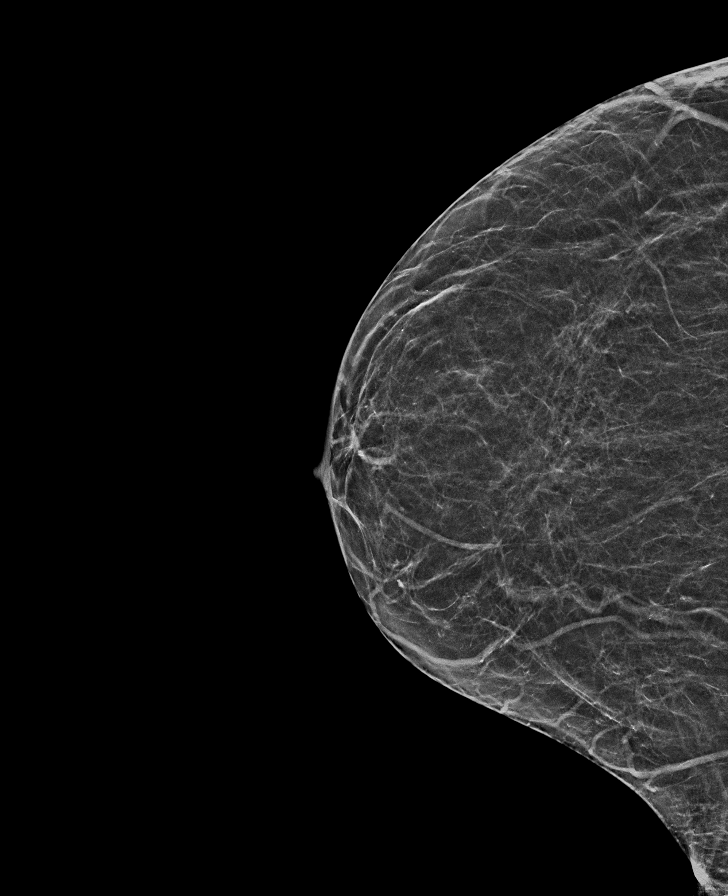

[L MLO synth-2D]
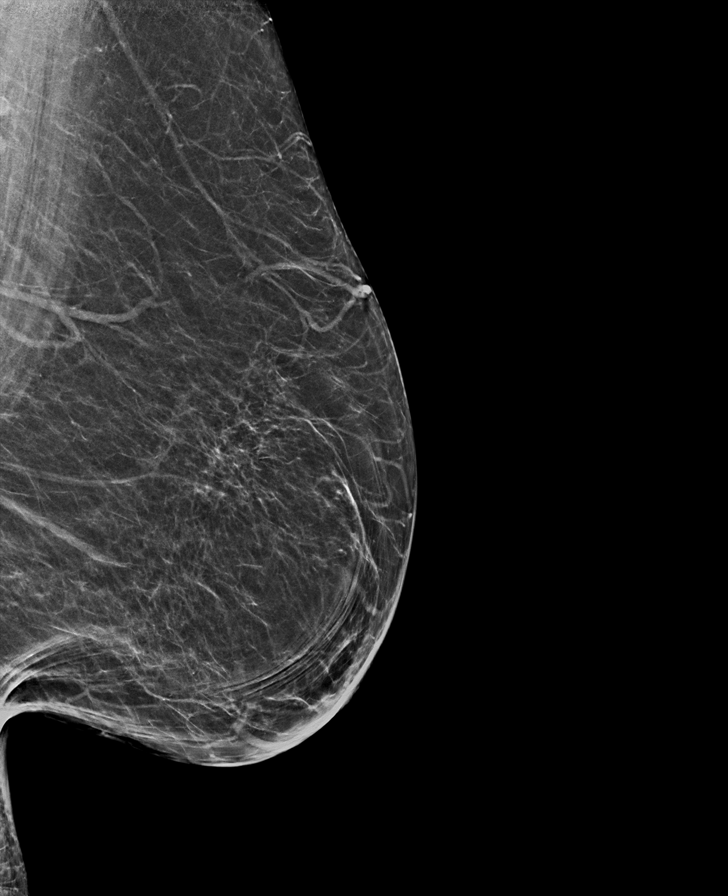

[R MLO synth-2D]
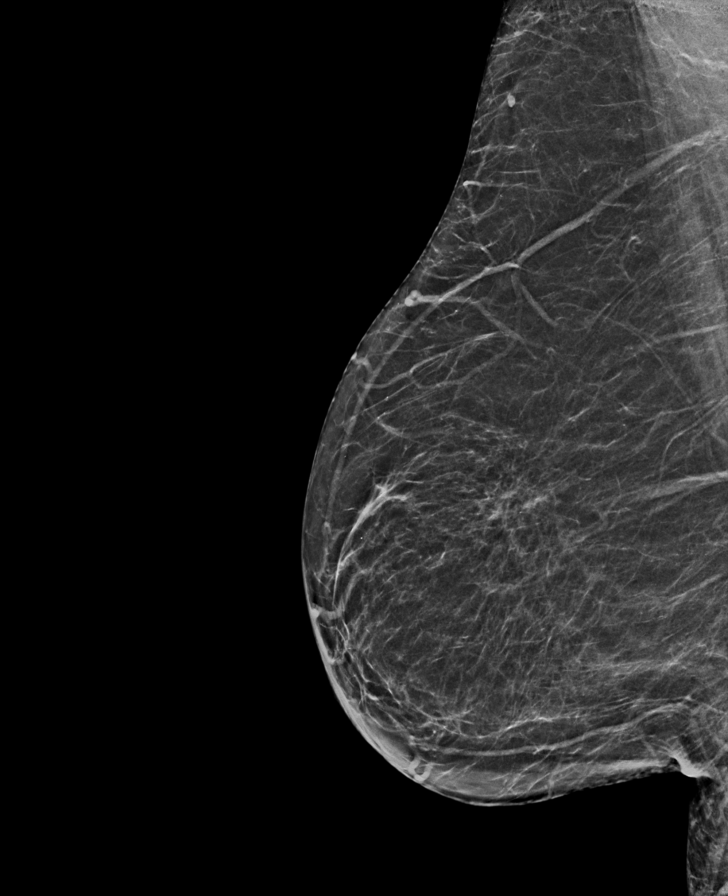

[L CC synth-2D]
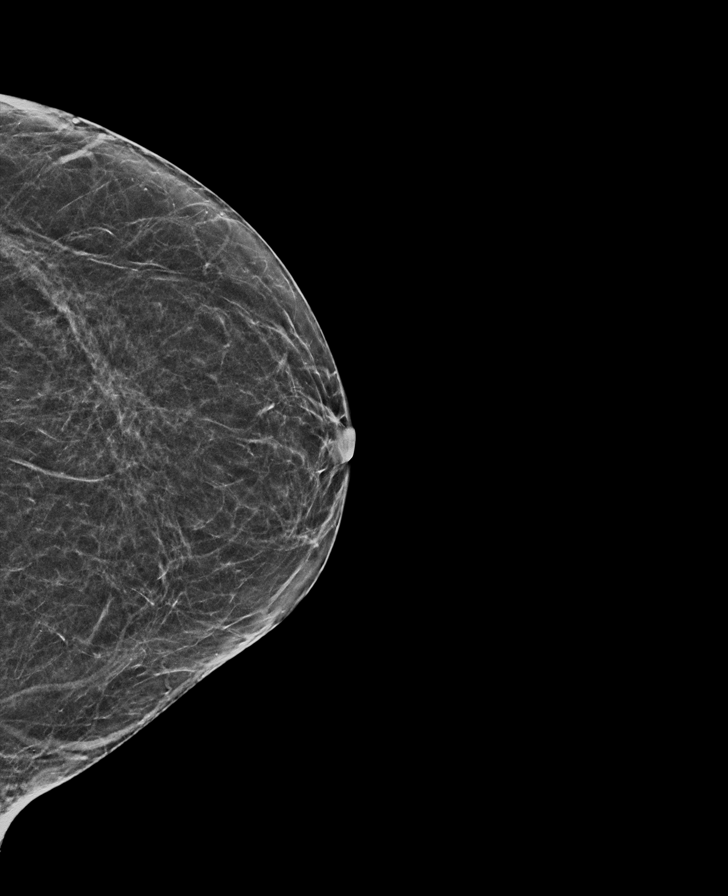

[L CC tomo · tomo slice 26/51.0]
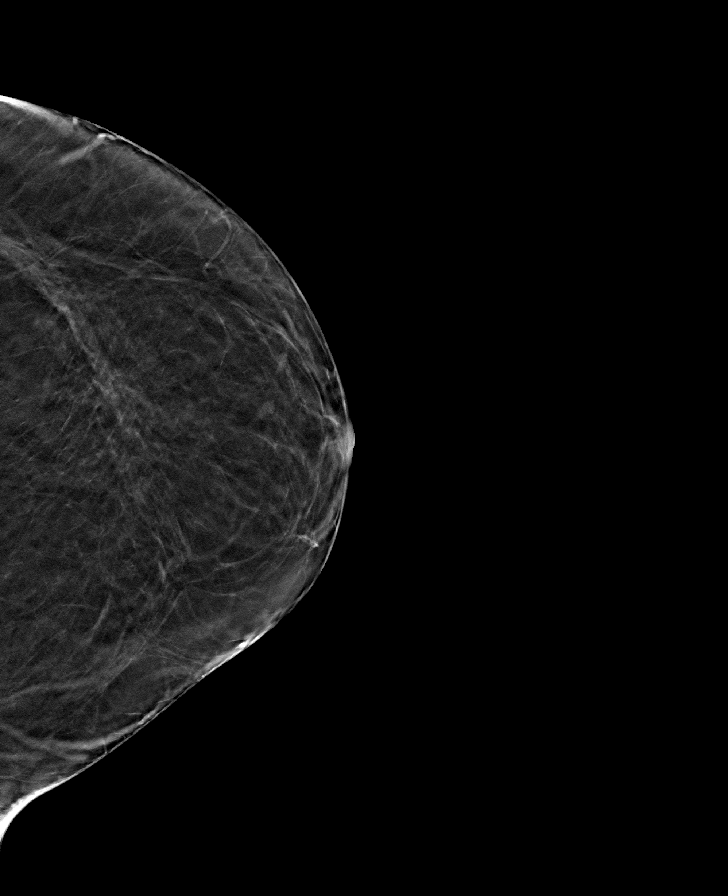

[L MLO tomo · tomo slice 31/62.0]
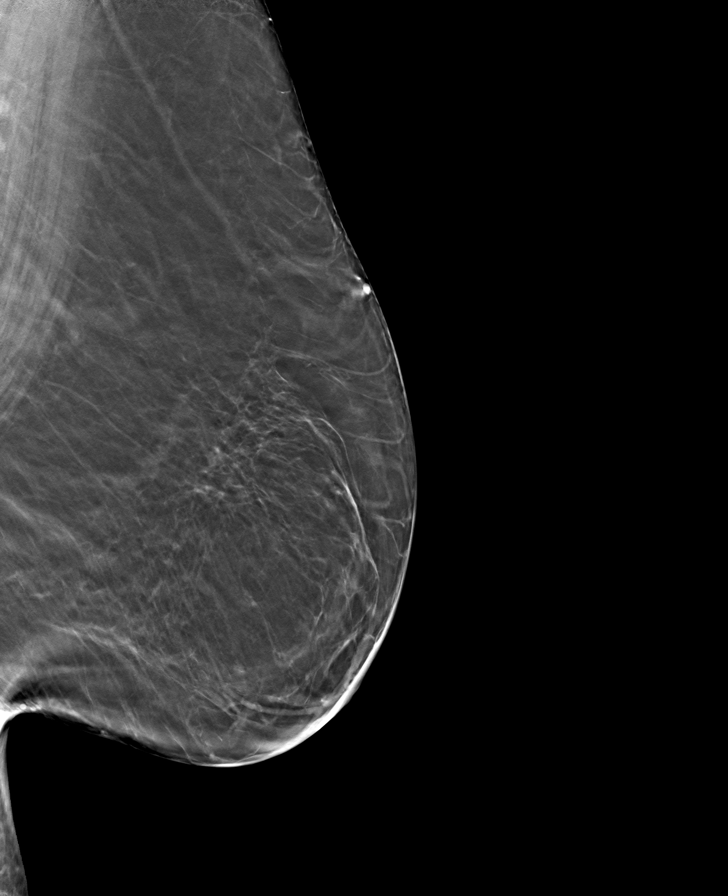

[R MLO tomo · tomo slice 29/58.0]
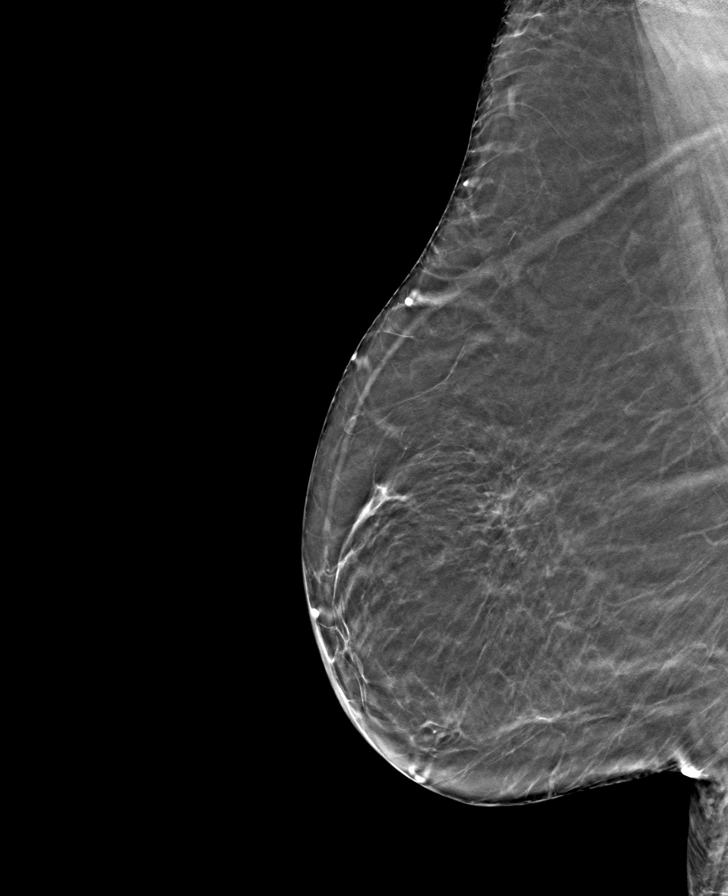

[R CC tomo · tomo slice 25/50.0]
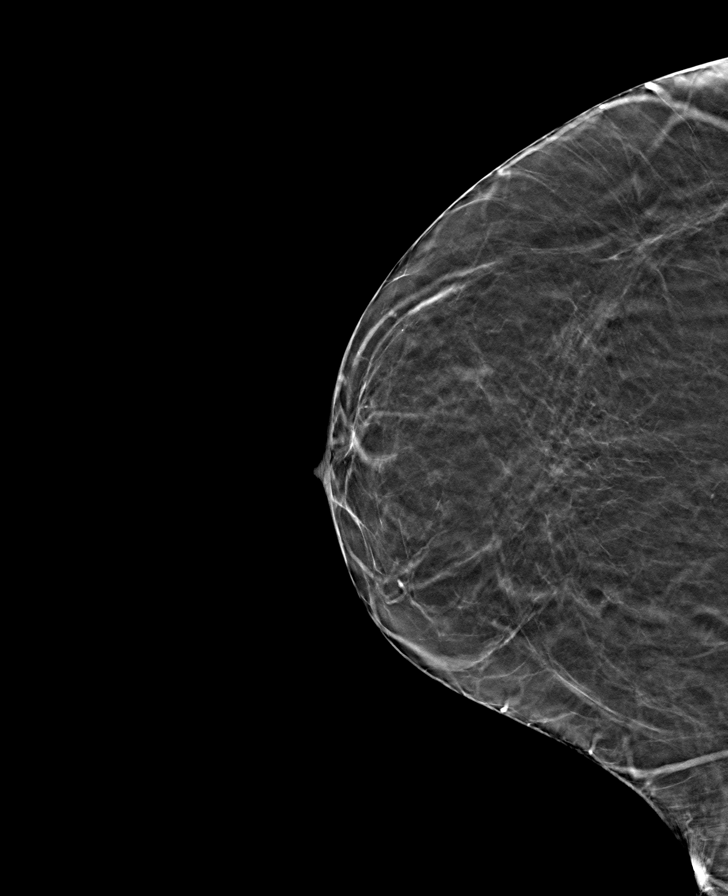

[8 of 24 positions shown; findings below may reference images not displayed]

ACR Breast Density Category b: There are scattered areas of
fibroglandular density.
FINDINGS: There are no findings suspicious for malignancy.
IMPRESSION: No mammographic evidence of malignancy. A result letter of this
screening mammogram will be mailed directly to the patient.

RECOMMENDATION:
Screening mammogram in one year. (Code:51-O-LD2)

BI-RADS CATEGORY  1: Negative.

## 2023-12-12 ENCOUNTER — Ambulatory Visit: Payer: 59

## 2023-12-12 DIAGNOSIS — Z1211 Encounter for screening for malignant neoplasm of colon: Secondary | ICD-10-CM | POA: Diagnosis present

## 2023-12-12 DIAGNOSIS — D125 Benign neoplasm of sigmoid colon: Secondary | ICD-10-CM | POA: Diagnosis not present

## 2024-02-18 ENCOUNTER — Other Ambulatory Visit: Payer: Self-pay | Admitting: Pediatrics

## 2024-02-18 DIAGNOSIS — Z1231 Encounter for screening mammogram for malignant neoplasm of breast: Secondary | ICD-10-CM

## 2024-03-19 ENCOUNTER — Ambulatory Visit
Admission: RE | Admit: 2024-03-19 | Discharge: 2024-03-19 | Disposition: A | Discharge status: Home / Self Care | Payer: Self-pay | Source: Ambulatory Visit | Attending: Pediatrics | Admitting: Pediatrics

## 2024-03-19 DIAGNOSIS — Z1231 Encounter for screening mammogram for malignant neoplasm of breast: Secondary | ICD-10-CM | POA: Diagnosis present
